# Patient Record
Sex: Male | Born: 1990 | Race: White | Hispanic: No | Marital: Single | Smoking: Never smoker
Health system: Southern US, Community
[De-identification: ages and names within clinical notes are randomized; demographics above are authoritative.]

## PROBLEM LIST (undated history)

## (undated) HISTORY — PX: TONSILLECTOMY: SUR1361

---

## 2013-06-19 HISTORY — PX: NOSE SURGERY: SHX723

## 2016-12-02 ENCOUNTER — Emergency Department
Admission: EM | Admit: 2016-12-02 | Discharge: 2016-12-02 | Disposition: A | Discharge status: Home / Self Care | Payer: Self-pay | Attending: Emergency Medicine | Admitting: Emergency Medicine

## 2016-12-02 ENCOUNTER — Encounter: Payer: Self-pay | Admitting: Emergency Medicine

## 2016-12-02 ENCOUNTER — Emergency Department: Payer: Self-pay

## 2016-12-02 DIAGNOSIS — Y929 Unspecified place or not applicable: Secondary | ICD-10-CM | POA: Insufficient documentation

## 2016-12-02 DIAGNOSIS — Y999 Unspecified external cause status: Secondary | ICD-10-CM | POA: Insufficient documentation

## 2016-12-02 DIAGNOSIS — Y9364 Activity, baseball: Secondary | ICD-10-CM | POA: Insufficient documentation

## 2016-12-02 DIAGNOSIS — S022XXA Fracture of nasal bones, initial encounter for closed fracture: Secondary | ICD-10-CM

## 2016-12-02 DIAGNOSIS — W2103XA Struck by baseball, initial encounter: Secondary | ICD-10-CM | POA: Insufficient documentation

## 2016-12-02 MED ORDER — IBUPROFEN 600 MG PO TABS: 600.0000 mg | ORAL_TABLET | Freq: Three times a day (TID) | ORAL | 0 refills | Status: DC | PRN

## 2016-12-02 MED ORDER — OXYCODONE-ACETAMINOPHEN 5-325 MG PO TABS
1.0000 | ORAL_TABLET | Freq: Once | ORAL | Status: AC
  Administered 2016-12-02: 1 via ORAL
  Filled 2016-12-02: qty 1

## 2016-12-02 MED ORDER — IBUPROFEN 600 MG PO TABS
600.0000 mg | ORAL_TABLET | Freq: Once | ORAL | Status: AC
  Administered 2016-12-02: 600 mg via ORAL
  Filled 2016-12-02: qty 1

## 2016-12-02 MED ORDER — OXYCODONE-ACETAMINOPHEN 7.5-325 MG PO TABS: 1.0000 | ORAL_TABLET | Freq: Four times a day (QID) | ORAL | 0 refills | Status: DC | PRN

## 2016-12-02 NOTE — ED Triage Notes (Signed)
Patient reports being hit in the face with a softball. Reports nose bleed. Swelling noted to nose. Bleeding controlled at this time.

## 2016-12-02 NOTE — ED Provider Notes (Signed)
[facility]   ____________________________________________   None    (approximate)  I have reviewed the triage vital signs and the nursing notes.   HISTORY  Chief Complaint Facial Injury    HPI Evan Le is a 26 y.o. male patient complaining of nasal and forehead pain secondary being hit with softball. Patient denies loss see. Patient state resolved epistaxis. Patient denies LOC, vision disturbance, or vertigo.  History reviewed. No pertinent past medical history.  There are no active problems to display for this patient.   Past Surgical History:  Procedure Laterality Date  . TONSILLECTOMY      Prior to Admission medications   Not on File    Allergies Patient has no known allergies.  No family history on file.  Social History Social History  Substance Use Topics  . Smoking status: Never Smoker  . Smokeless tobacco: Never Used  . Alcohol use Yes    Review of Systems  Constitutional: No fever/chills Eyes: No visual changes. ENT: No sore throat. Mild left lateral deviation of the nose. Cardiovascular: Denies chest pain. Respiratory: Denies shortness of breath. Gastrointestinal: No abdominal pain.  No nausea, no vomiting.  No diarrhea.  No constipation. Genitourinary: Negative for dysuria. Musculoskeletal: Negative for back pain. Skin: Negative for rash. Neurological: Negative for headaches, focal weakness or numbness.   ____________________________________________   PHYSICAL EXAM:  VITAL SIGNS: ED Triage Vitals  Enc Vitals Group     BP 12/02/16 1406 (!) 129/93     Pulse Rate 12/02/16 1406 93     Resp 12/02/16 1406 16     Temp 12/02/16 1406 98.2 F (36.8 C)     Temp Source 12/02/16 1406 Oral     SpO2 12/02/16 1406 99 %     Weight 12/02/16 1359 210 lb (95.3 kg)     Height 12/02/16 1359 6' (1.829 m)     Head Circumference --      Peak Flow --      Pain Score 12/02/16 1358 4      Pain Loc --      Pain Edu? --      Excl. in GC? --     Constitutional: Alert and oriented. Well appearing and in no acute distress. Eyes: Conjunctivae are normal. PERRL. EOMI. Head: Atraumatic. Nose: No congestion/rhinnorhea.Mild left lateral deviation with marked edema. Mouth/Throat: Mucous membranes are moist.  Oropharynx non-erythematous. Neck: No stridor.  No cervical spine tenderness to palpation. Hematological/Lymphatic/Immunilogical: No cervical lymphadenopathy. Cardiovascular: Normal rate, regular rhythm. Grossly normal heart sounds.  Good peripheral circulation. Respiratory: Normal respiratory effort.  No retractions. Lungs CTAB. Neurologic:  Normal speech and language. No gross focal neurologic deficits are appreciated. No gait instability. Skin:  Skin is warm, dry and intact. No rash noted. Psychiatric: Mood and affect are normal. Speech and behavior are normal.  ____________________________________________   LABS (all labs ordered are listed, but only abnormal results are displayed)  Labs Reviewed - No data to display ____________________________________________  EKG   ____________________________________________  RADIOLOGY  No results found.  Nasal fracture __________________________________________   PROCEDURES  Procedure(s) performed: None  Procedures  Critical Care performed: No  ____________________________________________   INITIAL IMPRESSION / ASSESSMENT AND PLAN / ED COURSE  Pertinent labs & imaging results that were available during my care of the patient were reviewed by me and considered in my medical decision making (see chart for details).  Nasal fracture. Discussed x-ray finding with patient. Patient given discharge care  instructions and advised to follow-up with [facility] in 2 days. Return by ER if condition worsens.      ____________________________________________   FINAL CLINICAL IMPRESSION(S) / ED  DIAGNOSES  Final diagnoses:  None      NEW MEDICATIONS STARTED DURING THIS VISIT:  New Prescriptions   No medications on file     Note:  This document was prepared using Dragon voice recognition software and may include unintentional dictation errors.    Joni Reining, PA-C 12/02/16 1504    Schaevitz, Myra Rude, MD 12/02/16 1534

## 2016-12-08 ENCOUNTER — Ambulatory Visit
Admission: RE | Admit: 2016-12-08 | Discharge: 2016-12-08 | Disposition: A | Discharge status: Home / Self Care | Payer: Managed Care, Other (non HMO) | Source: Ambulatory Visit | Attending: Unknown Physician Specialty | Admitting: Unknown Physician Specialty

## 2016-12-08 ENCOUNTER — Encounter: Payer: Self-pay | Admitting: *Deleted

## 2016-12-08 ENCOUNTER — Encounter
Admission: RE | Disposition: A | Discharge status: Home / Self Care | Payer: Self-pay | Source: Ambulatory Visit | Attending: Unknown Physician Specialty

## 2016-12-08 ENCOUNTER — Ambulatory Visit: Payer: Managed Care, Other (non HMO) | Admitting: Student in an Organized Health Care Education/Training Program

## 2016-12-08 DIAGNOSIS — X58XXXA Exposure to other specified factors, initial encounter: Secondary | ICD-10-CM | POA: Insufficient documentation

## 2016-12-08 DIAGNOSIS — S022XXA Fracture of nasal bones, initial encounter for closed fracture: Secondary | ICD-10-CM | POA: Insufficient documentation

## 2016-12-08 DIAGNOSIS — Y9364 Activity, baseball: Secondary | ICD-10-CM | POA: Insufficient documentation

## 2016-12-08 HISTORY — PX: CLOSED REDUCTION NASAL FRACTURE: SHX5365

## 2016-12-08 SURGERY — CLOSED REDUCTION, FRACTURE, NASAL BONE
Anesthesia: General | Site: Nose | Wound class: Clean Contaminated

## 2016-12-08 MED ORDER — FENTANYL CITRATE (PF) 100 MCG/2ML IJ SOLN
INTRAMUSCULAR | Status: DC | PRN
  Administered 2016-12-08: 50 ug via INTRAVENOUS

## 2016-12-08 MED ORDER — ONDANSETRON HCL 4 MG/2ML IJ SOLN
INTRAMUSCULAR | Status: DC | PRN
  Administered 2016-12-08: 4 mg via INTRAVENOUS

## 2016-12-08 MED ORDER — MIDAZOLAM HCL 5 MG/5ML IJ SOLN
INTRAMUSCULAR | Status: DC | PRN
  Administered 2016-12-08: 2 mg via INTRAVENOUS

## 2016-12-08 MED ORDER — OXYMETAZOLINE HCL 0.05 % NA SOLN
1.0000 | Freq: Two times a day (BID) | NASAL | Status: DC
  Administered 2016-12-08: 1 via NASAL

## 2016-12-08 MED ORDER — PHENYLEPHRINE HCL 0.5 % NA SOLN
NASAL | Status: DC | PRN
  Administered 2016-12-08: 30 mL via TOPICAL

## 2016-12-08 MED ORDER — ONDANSETRON HCL 4 MG/2ML IJ SOLN: 4.0000 mg | Freq: Once | INTRAMUSCULAR | Status: DC | PRN

## 2016-12-08 MED ORDER — ACETAMINOPHEN 10 MG/ML IV SOLN
1000.0000 mg | Freq: Once | INTRAVENOUS | Status: AC
  Administered 2016-12-08: 1000 mg via INTRAVENOUS

## 2016-12-08 MED ORDER — LACTATED RINGERS IV SOLN
INTRAVENOUS | Status: DC
  Administered 2016-12-08: 10:00:00 via INTRAVENOUS

## 2016-12-08 MED ORDER — FENTANYL CITRATE (PF) 100 MCG/2ML IJ SOLN
25.0000 ug | INTRAMUSCULAR | Status: DC | PRN
  Administered 2016-12-08: 25 ug via INTRAVENOUS

## 2016-12-08 MED ORDER — OXYCODONE HCL 5 MG/5ML PO SOLN: 5.0000 mg | Freq: Once | ORAL | Status: AC | PRN

## 2016-12-08 MED ORDER — OXYCODONE HCL 5 MG PO TABS
5.0000 mg | ORAL_TABLET | Freq: Once | ORAL | Status: AC | PRN
  Administered 2016-12-08: 5 mg via ORAL

## 2016-12-08 MED ORDER — LIDOCAINE HCL (CARDIAC) 20 MG/ML IV SOLN
INTRAVENOUS | Status: DC | PRN
  Administered 2016-12-08: 50 mg via INTRATRACHEAL

## 2016-12-08 MED ORDER — DEXAMETHASONE SODIUM PHOSPHATE 4 MG/ML IJ SOLN
INTRAMUSCULAR | Status: DC | PRN
  Administered 2016-12-08: 8 mg via INTRAVENOUS

## 2016-12-08 MED ORDER — LACTATED RINGERS IV SOLN: 500.0000 mL | INTRAVENOUS | Status: DC

## 2016-12-08 MED ORDER — SCOPOLAMINE 1 MG/3DAYS TD PT72
1.0000 | MEDICATED_PATCH | TRANSDERMAL | Status: DC
  Administered 2016-12-08: 1.5 mg via TRANSDERMAL

## 2016-12-08 MED ORDER — PROPOFOL 10 MG/ML IV BOLUS
INTRAVENOUS | Status: DC | PRN
  Administered 2016-12-08: 200 mg via INTRAVENOUS

## 2016-12-08 MED ORDER — OXYCODONE-ACETAMINOPHEN 5-325 MG PO TABS: 1.0000 | ORAL_TABLET | ORAL | 0 refills | Status: DC | PRN

## 2016-12-08 SURGICAL SUPPLY — 16 items
BASIN GRAD PLASTIC 32OZ STRL (MISCELLANEOUS) ×3 IMPLANT
CANISTER SUCT 1200ML W/VALVE (MISCELLANEOUS) ×3 IMPLANT
CLOSURE WOUND 1/2 X4 (GAUZE/BANDAGES/DRESSINGS) ×1
COAG SUCT 10F 3.5MM HAND CTRL (MISCELLANEOUS) IMPLANT
COVER MAYO STAND STRL (DRAPES) ×3 IMPLANT
CUP MEDICINE 2OZ PLAST GRAD ST (MISCELLANEOUS) ×6 IMPLANT
ELECT REM PT RETURN 9FT ADLT (ELECTROSURGICAL)
ELECTRODE REM PT RTRN 9FT ADLT (ELECTROSURGICAL) IMPLANT
GAUZE SPONGE 4X4 12PLY STRL (GAUZE/BANDAGES/DRESSINGS) ×3 IMPLANT
GLOVE BIO SURGEON STRL SZ7.5 (GLOVE) ×6 IMPLANT
SPONGE NEURO XRAY DETECT 1X3 (DISPOSABLE) ×3 IMPLANT
STRIP CLOSURE SKIN 1/2X4 (GAUZE/BANDAGES/DRESSINGS) ×2 IMPLANT
SUCTION FRAZIER TIP 10 FR DISP (SUCTIONS) ×3 IMPLANT
TOWEL OR 17X26 4PK STRL BLUE (TOWEL DISPOSABLE) ×3 IMPLANT
TUBING CONNECTING 10 (TUBING) ×2 IMPLANT
TUBING CONNECTING 10' (TUBING) ×1

## 2016-12-08 NOTE — Transfer of Care (Signed)
Immediate Anesthesia Transfer of Care Note  Patient: Evan Le  Procedure(s) Performed: Procedure(s): CLOSED REDUCTION NASAL FRACTURE (N/A)  Patient Location: PACU  Anesthesia Type: General  Level of Consciousness: awake, alert  and patient cooperative  Airway and Oxygen Therapy: Patient Spontanous Breathing and Patient connected to supplemental oxygen  Post-op Assessment: Post-op Vital signs reviewed, Patient's Cardiovascular Status Stable, Respiratory Function Stable, Patent Airway and No signs of Nausea or vomiting  Post-op Vital Signs: Reviewed and stable  Complications: No apparent anesthesia complications

## 2016-12-08 NOTE — Anesthesia Preprocedure Evaluation (Addendum)
Anesthesia Evaluation  Patient identified by MRN, date of birth, ID band Patient awake    Reviewed: Allergy & Precautions, H&P , NPO status , Patient's Chart, lab work & pertinent test results, reviewed documented beta blocker date and time   Airway Mallampati: II  TM Distance: >3 FB Neck ROM: full    Dental no notable dental hx.    Pulmonary neg pulmonary ROS,    Pulmonary exam normal breath sounds clear to auscultation       Cardiovascular Exercise Tolerance: Good negative cardio ROS   Rhythm:regular Rate:Normal     Neuro/Psych negative neurological ROS  negative psych ROS   GI/Hepatic negative GI ROS, Neg liver ROS,   Endo/Other  negative endocrine ROS  Renal/GU negative Renal ROS  negative genitourinary   Musculoskeletal   Abdominal   Peds  Hematology negative hematology ROS (+)   Anesthesia Other Findings   Reproductive/Obstetrics negative OB ROS                             Anesthesia Physical Anesthesia Plan  ASA: I  Anesthesia Plan: General   Post-op Pain Management:    Induction:   PONV Risk Score and Plan: 2 and Ondansetron, Dexamethasone, Propofol, Scopolamine patch - Pre-op and Midazolam  Airway Management Planned:   Additional Equipment:   Intra-op Plan:   Post-operative Plan:   Informed Consent: I have reviewed the patients History and Physical, chart, labs and discussed the procedure including the risks, benefits and alternatives for the proposed anesthesia with the patient or authorized representative who has indicated his/her understanding and acceptance.     Plan Discussed with: CRNA  Anesthesia Plan Comments:         Anesthesia Quick Evaluation

## 2016-12-08 NOTE — Discharge Instructions (Signed)
General Anesthesia, Adult, Care After °These instructions provide you with information about caring for yourself after your procedure. Your health care provider may also give you more specific instructions. Your treatment has been planned according to current medical practices, but problems sometimes occur. Call your health care provider if you have any problems or questions after your procedure. °What can I expect after the procedure? °After the procedure, it is common to have: °· Vomiting. °· A sore throat. °· Mental slowness. ° °It is common to feel: °· Nauseous. °· Cold or shivery. °· Sleepy. °· Tired. °· Sore or achy, even in parts of your body where you did not have surgery. ° °Follow these instructions at home: °For at least 24 hours after the procedure: °· Do not: °? Participate in activities where you could fall or become injured. °? Drive. °? Use heavy machinery. °? Drink alcohol. °? Take sleeping pills or medicines that cause drowsiness. °? Make important decisions or sign legal documents. °? Take care of children on your own. °· Rest. °Eating and drinking °· If you vomit, drink water, juice, or soup when you can drink without vomiting. °· Drink enough fluid to keep your urine clear or pale yellow. °· Make sure you have little or no nausea before eating solid foods. °· Follow the diet recommended by your health care provider. °General instructions °· Have a responsible adult stay with you until you are awake and alert. °· Return to your normal activities as told by your health care provider. Ask your health care provider what activities are safe for you. °· Take over-the-counter and prescription medicines only as told by your health care provider. °· If you smoke, do not smoke without supervision. °· Keep all follow-up visits as told by your health care provider. This is important. °Contact a health care provider if: °· You continue to have nausea or vomiting at home, and medicines are not helpful. °· You  cannot drink fluids or start eating again. °· You cannot urinate after 8-12 hours. °· You develop a skin rash. °· You have fever. °· You have increasing redness at the site of your procedure. °Get help right away if: °· You have difficulty breathing. °· You have chest pain. °· You have unexpected bleeding. °· You feel that you are having a life-threatening or urgent problem. °This information is not intended to replace advice given to you by your health care provider. Make sure you discuss any questions you have with your health care provider. °Document Released: 09/11/2000 Document Revised: 11/08/2015 Document Reviewed: 05/20/2015 °Elsevier Interactive Patient Education © 2018 [employer] °Scopolamine skin patches °What is this medicine? °SCOPOLAMINE (skoe POL a meen) is used to prevent nausea and vomiting caused by motion sickness, anesthesia and surgery. °This medicine may be used for other purposes; ask your health care provider or pharmacist if you have questions. °COMMON BRAND NAME(S): Transderm Scop °What should I tell my health care provider before I take this medicine? °They need to know if you have any of these conditions: °-glaucoma °-kidney or liver disease °-an unusual or allergic reaction (especially skin allergy) to scopolamine, atropine, other medicines, foods, dyes, or preservatives °-pregnant or trying to get pregnant °-breast-feeding °How should I use this medicine? °This medicine is for external use only. Follow the directions on the prescription label. One patch contains enough medicine to prevent motion sickness for up to 3 days. Apply the patch at least 4 hours before you need it and only wear one disc at   a time. Choose an area behind the ear, that is clean, dry, hairless and free from any cuts or irritation. Wipe the area with a clean dry tissue. Peel off the plastic backing of the skin patch, trying not to touch the adhesive side with your hands. Do not cut the patches. Firmly apply to the  area you have chosen, with the metallic side of the patch to the skin and the tan-colored side showing. Once firmly in place, wash your hands well with soap and water. Remove the disc after 3 days, or sooner if you no longer need it. After removing the patch, wash your hands and the area behind your ear thoroughly with soap and water. The patch will still contain some medicine after use. To avoid accidental contact or ingestion by children or pets, fold the used patch in half with the sticky side together and throw away in the trash out of the reach of children and pets. If you need to use a second patch after you remove the first, place it behind the other ear. °Talk to your pediatrician regarding the use of this medicine in children. Special care may be needed. °Overdosage: If you think you have taken too much of this medicine contact a poison control center or emergency room at once. °NOTE: This medicine is only for you. Do not share this medicine with others. °What if I miss a dose? °Make sure you apply the patch at least 4 hours before you need it. You can apply it the night before traveling. °What may interact with this medicine? °-benztropine °-bethanechol °-medicines for anxiety or sleeping problems like diazepam or temazepam °-medicines for hay fever and other allergies °-medicines for mental depression °-muscle relaxants °This list may not describe all possible interactions. Give your health care provider a list of all the medicines, herbs, non-prescription drugs, or dietary supplements you use. Also tell them if you smoke, drink alcohol, or use illegal drugs. Some items may interact with your medicine. °What should I watch for while using this medicine? °Keep the patch dry, if possible, to prevent it from falling off. Limited contact with water, however, as in bathing or swimming, will not affect the system. If the patch falls off, throw it away and put a new one behind the other ear. °You may get drowsy  or dizzy. Do not drive, use machinery, or do anything that needs mental alertness until you know how this medicine affects you. Do not stand or sit up quickly, especially if you are an older patient. This reduces the risk of dizzy or fainting spells. Alcohol may interfere with the effect of this medicine. Avoid alcoholic drinks. °Your mouth may get dry. Chewing sugarless gum or sucking hard candy, and drinking plenty of water may help. Contact your doctor if the problem does not go away or is severe. °This medicine may cause dry eyes and blurred vision. If you wear contact lenses you may feel some discomfort. Lubricating drops may help. See your eye doctor if the problem does not go away or is severe. °If you are going to have a magnetic resonance imaging (MRI) procedure, tell your MRI technician if you have this patch on your body. It must be removed before a MRI. °What side effects may I notice from receiving this medicine? °Side effects that you should report to your doctor or health care professional as soon as possible: °-agitation, nervousness, confusion °-blurred vision and other eye problems °-dizziness, drowsiness °-eye pain or redness in the   whites of the eye °-hallucinations °-pain or difficulty passing urine °-skin rash, itching °-vomiting °Side effects that usually do not require medical attention (report to your doctor or health care professional if they continue or are bothersome): °-headache °-nausea °This list may not describe all possible side effects. Call your doctor for medical advice about side effects. You may report side effects to FDA at 1-800-FDA-1088. °Where should I keep my medicine? °Keep out of the reach of children. °Store at room temperature between 20 and 25 degrees C (68 and 77 degrees F). Throw away any unused medicine after the expiration date. When you remove a patch, fold it and throw it in the trash as described above. °NOTE: This sheet is a summary. It may not cover all  possible information. If you have questions about this medicine, talk to your doctor, pharmacist, or health care provider. °© 2018 Elsevier/Gold Standard (2011-11-02 13:31:48) ° °

## 2016-12-08 NOTE — Anesthesia Postprocedure Evaluation (Signed)
Anesthesia Post Note  Patient: Evan Le  Procedure(s) Performed: Procedure(s) (LRB): CLOSED REDUCTION NASAL FRACTURE (N/A)  Patient location during evaluation: PACU Anesthesia Type: General Level of consciousness: awake and alert Pain management: pain level controlled Vital Signs Assessment: post-procedure vital signs reviewed and stable Respiratory status: spontaneous breathing, nonlabored ventilation and respiratory function stable Cardiovascular status: blood pressure returned to baseline and stable Postop Assessment: no signs of nausea or vomiting Anesthetic complications: no    Tanisha Lutes D Loyalty Arentz

## 2016-12-08 NOTE — Anesthesia Procedure Notes (Signed)
Procedure Name: LMA Insertion Performed by: Shatonia Hoots Pre-anesthesia Checklist: Patient identified, Emergency Drugs available, Suction available, Timeout performed and Patient being monitored Patient Re-evaluated:Patient Re-evaluated prior to inductionOxygen Delivery Method: Circle system utilized Preoxygenation: Pre-oxygenation with 100% oxygen Intubation Type: IV induction LMA: LMA inserted LMA Size: 4.0 Number of attempts: 1 Placement Confirmation: positive ETCO2 and breath sounds checked- equal and bilateral Tube secured with: Tape     

## 2016-12-08 NOTE — Op Note (Signed)
12/08/2016  11:23 AM    Daniel Nonesurner, Jary  [id number]   Pre-Op Dx: NASAL FRACTURE  Post-op Dx: SAME  Proc: Closed reduction nasal fracture   Surg:  Linus SalmonsMCQUEEN,Tanice Petre T  Anes:  GOT  EBL:  Less than 5 cc  Comp:  None  Findings:  Outfracture left nasal bone infracture right nasal bone  Procedure: Each was identified in the holding area taken the operating room placed in supine position. After laryngeal mask anesthesia topical anesthetic of phenylephrine lidocaine was placed on pledgets within each nostril. After 5 minutes these were removed. The left nasal bone was manipulated back in anatomic position and elevator was placed in the right nostril in the right nasal bone was outfractured back in anatomic position this gave excellent alignment of the nasal fracture. Steri-Strips were then placed across nasal dorsum and a aqua Plast was fitted for the nose and placed across the dorsum as a cast. The patient was then awakened in the operating room taken recovery room in stable condition.  Cultures: None  Specimens: None  Dispo:   Good  Plan:  Discharge to home follow-up 1 week  Analayah Brooke T  12/08/2016 11:23 AM

## 2016-12-08 NOTE — H&P (Signed)
The patient's history has been reviewed, patient examined, no change in status, stable for surgery.  Questions were answered to the patients satisfaction.  

## 2018-10-18 IMAGING — CR DG NASAL BONES 3+V
1 series · 3 of 3 positions shown · non-contrast
Comparison: None.

CLINICAL DATA: Hit by baseball.  Deformity to the nose with pain.

EXAM:
NASAL BONES - 3+ VIEW

[Series 1: dg nasal bones · 0.14mm/px · 3 of 3 slices shown]
[im 1/3]
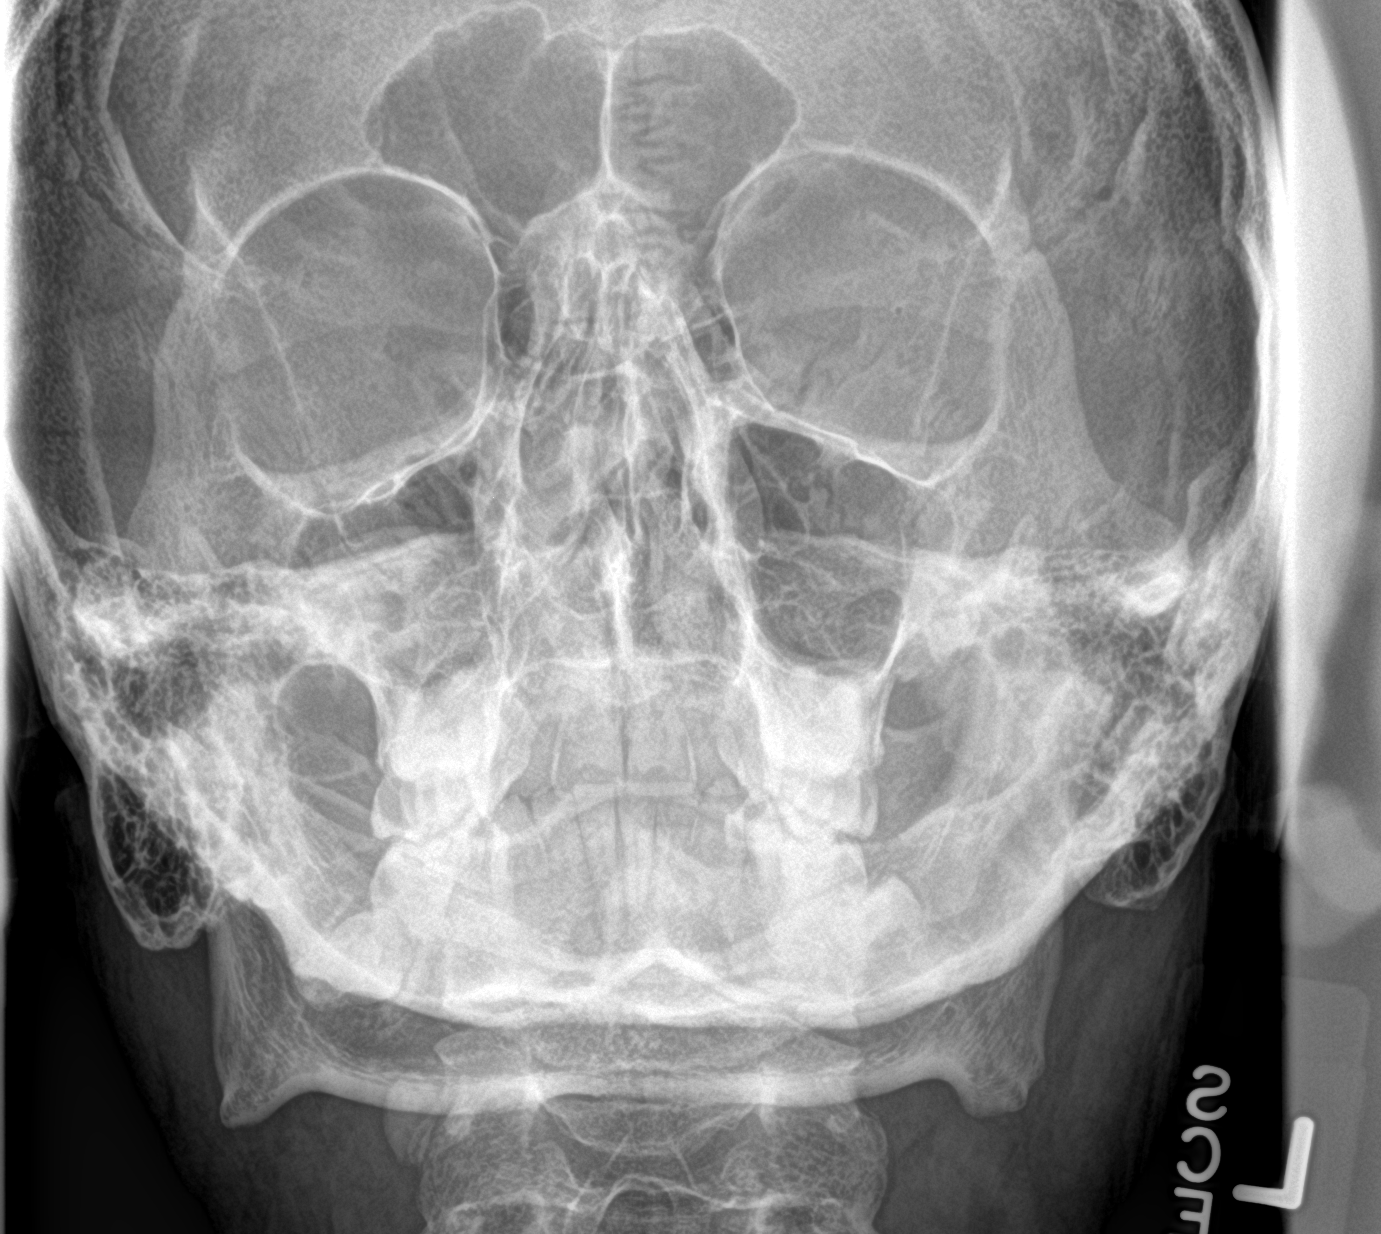
[im 2/3]
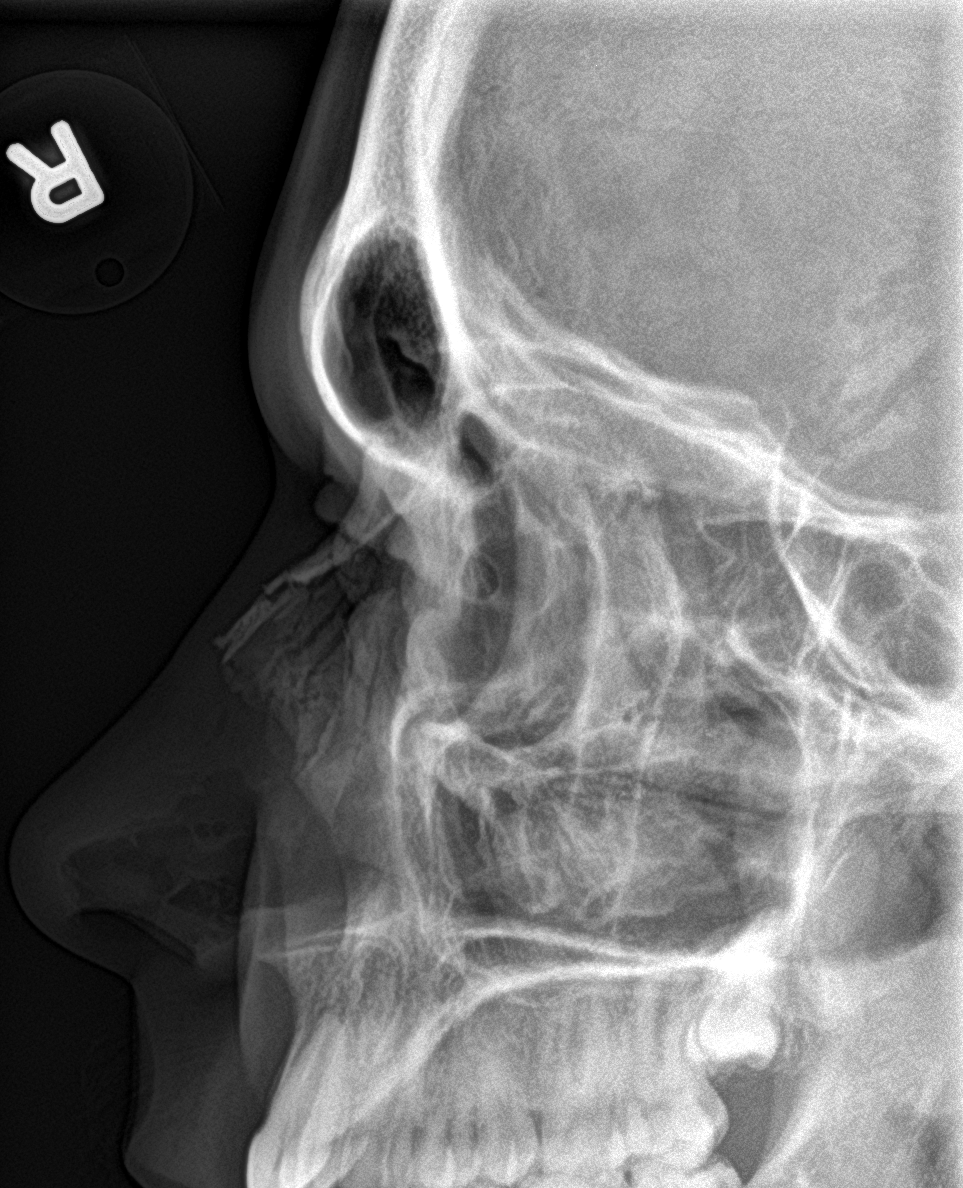
[im 3/3]
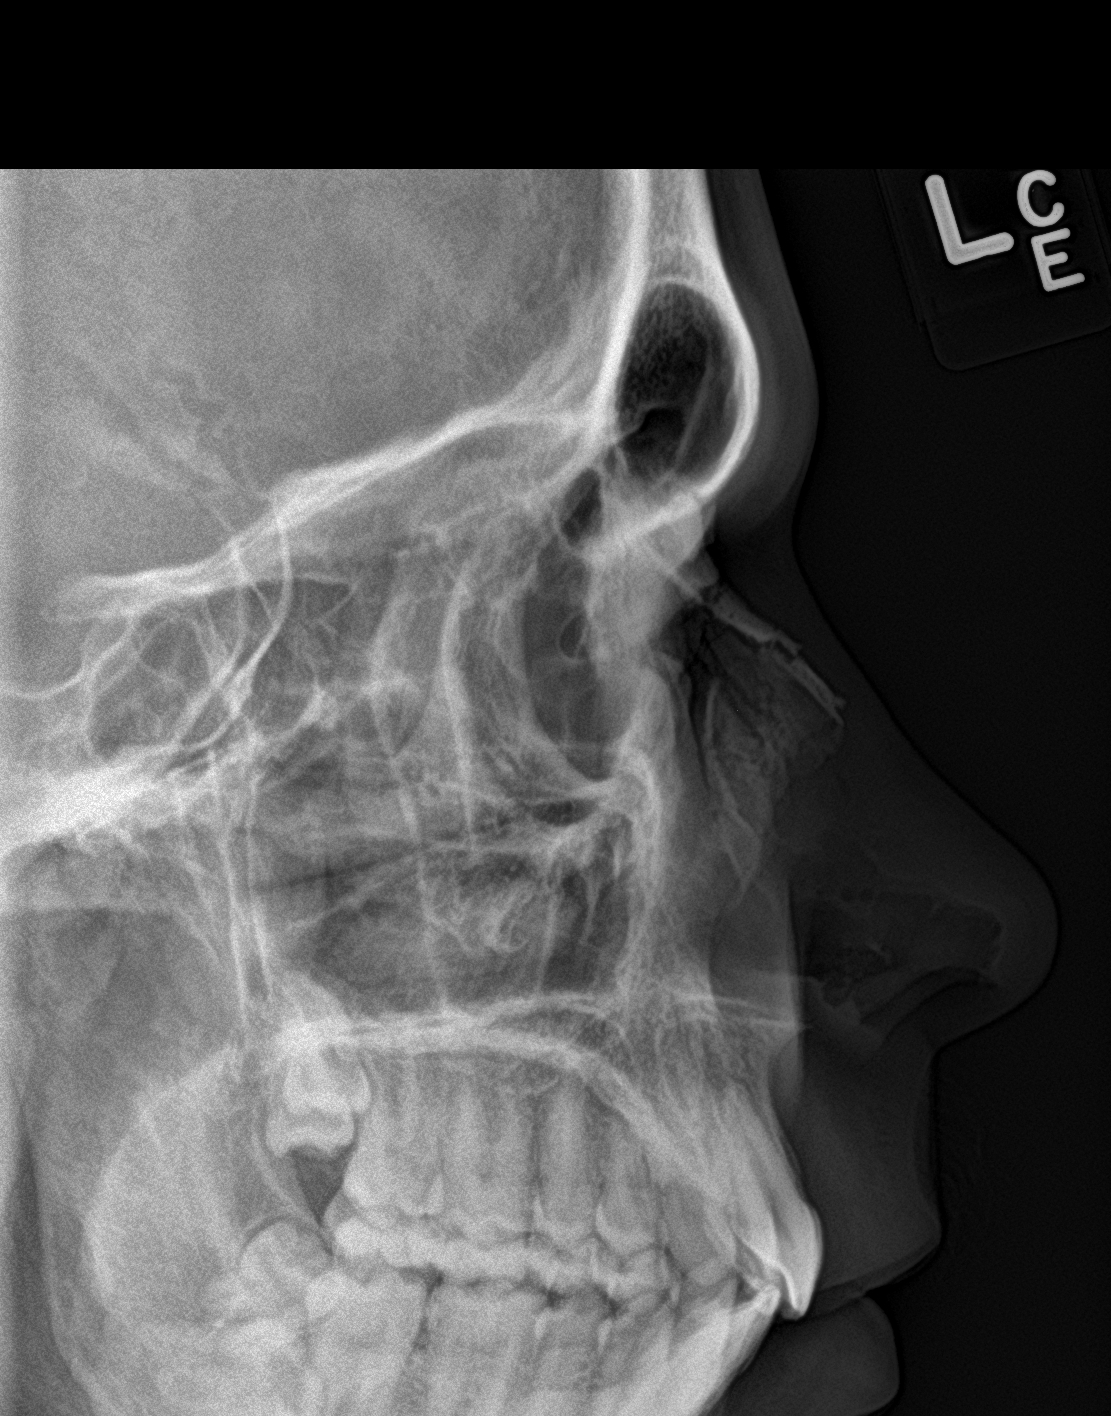

[3 of 3 positions shown; findings below may reference images not displayed]

FINDINGS: Positive for nasal bone fractures. Evidence for comminuted nasal
bone fractures. Visualized paranasal sinuses are aerated.
IMPRESSION: Comminuted nasal bone fractures.

## 2020-02-07 ENCOUNTER — Other Ambulatory Visit: Payer: Self-pay

## 2020-02-07 ENCOUNTER — Emergency Department
Admission: EM | Admit: 2020-02-07 | Discharge: 2020-02-07 | Disposition: A | Discharge status: Home / Self Care | Payer: 59 | Attending: Emergency Medicine | Admitting: Emergency Medicine

## 2020-02-07 ENCOUNTER — Emergency Department: Payer: 59

## 2020-02-07 DIAGNOSIS — S6982XA Other specified injuries of left wrist, hand and finger(s), initial encounter: Secondary | ICD-10-CM | POA: Diagnosis present

## 2020-02-07 DIAGNOSIS — Z23 Encounter for immunization: Secondary | ICD-10-CM | POA: Diagnosis not present

## 2020-02-07 DIAGNOSIS — Y999 Unspecified external cause status: Secondary | ICD-10-CM | POA: Insufficient documentation

## 2020-02-07 DIAGNOSIS — S62633B Displaced fracture of distal phalanx of left middle finger, initial encounter for open fracture: Secondary | ICD-10-CM | POA: Diagnosis not present

## 2020-02-07 DIAGNOSIS — Y929 Unspecified place or not applicable: Secondary | ICD-10-CM | POA: Diagnosis not present

## 2020-02-07 DIAGNOSIS — Y9389 Activity, other specified: Secondary | ICD-10-CM | POA: Diagnosis not present

## 2020-02-07 DIAGNOSIS — W208XXA Other cause of strike by thrown, projected or falling object, initial encounter: Secondary | ICD-10-CM | POA: Diagnosis not present

## 2020-02-07 MED ORDER — LIDOCAINE HCL (PF) 1 % IJ SOLN
5.0000 mL | Freq: Once | INTRAMUSCULAR | Status: AC
  Administered 2020-02-07: 21:00:00 5 mL via INTRADERMAL
  Filled 2020-02-07: qty 5

## 2020-02-07 MED ORDER — OXYCODONE-ACETAMINOPHEN 5-325 MG PO TABS
1.0000 | ORAL_TABLET | Freq: Once | ORAL | Status: AC
  Administered 2020-02-07: 21:00:00 1 via ORAL
  Filled 2020-02-07: qty 1

## 2020-02-07 MED ORDER — TETANUS-DIPHTH-ACELL PERTUSSIS 5-2.5-18.5 LF-MCG/0.5 IM SUSP
0.5000 mL | Freq: Once | INTRAMUSCULAR | Status: AC
  Administered 2020-02-07: 20:00:00 0.5 mL via INTRAMUSCULAR
  Filled 2020-02-07: qty 0.5

## 2020-02-07 MED ORDER — CEPHALEXIN 500 MG PO CAPS
500.0000 mg | ORAL_CAPSULE | Freq: Once | ORAL | Status: AC
  Administered 2020-02-07: 21:00:00 500 mg via ORAL
  Filled 2020-02-07: qty 1

## 2020-02-07 MED ORDER — OXYCODONE-ACETAMINOPHEN 5-325 MG PO TABS: 1.0000 | ORAL_TABLET | ORAL | 0 refills | Status: AC | PRN

## 2020-02-07 MED ORDER — CEPHALEXIN 500 MG PO CAPS: 500.0000 mg | ORAL_CAPSULE | Freq: Three times a day (TID) | ORAL | 0 refills | Status: AC

## 2020-02-07 NOTE — ED Notes (Signed)
Pt is soaking hand in iodine and NS. Pt states several hours ago he dropped a transmission on his hand. Pt states tingling to the 3rd finger on the left hand

## 2020-02-07 NOTE — ED Triage Notes (Signed)
Pt dropped a transmission on left third finger approx 2 hours pta. Laceration noted. Pt is able to move finger.

## 2020-02-07 NOTE — ED Notes (Signed)
petroleum dressing placed to left middle finger. Splint placed over dressings.  Pts wife stated she will be driving the pt home.

## 2020-02-07 NOTE — ED Provider Notes (Signed)
[facility] Emergency Department Provider Note  ____________________________________________   First MD Initiated Contact with Patient 02/07/20 1957     (approximate)  I have reviewed the triage vital signs and the nursing notes.   HISTORY  Chief Complaint Finger Injury    HPI Evan Le is a 29 y.o. male presents emergency department after dropping a transmission on his left middle finger.  Patient is unsure of his last tetanus shot.  He is complaining of a lot of pain in the area.  Laceration around the nail.  No other injuries reported.  Tdap is not up-to-date    No past medical history on file.  There are no problems to display for this patient.   Past Surgical History:  Procedure Laterality Date  . CLOSED REDUCTION NASAL FRACTURE N/A 12/08/2016   Procedure: CLOSED REDUCTION NASAL FRACTURE;  Surgeon: Linus Salmons, MD;  Location: [facility];  Service: ENT;  Laterality: N/A;  . TONSILLECTOMY      Prior to Admission medications   Medication Sig Start Date End Date Taking? Authorizing Provider  cephALEXin (KEFLEX) 500 MG capsule Take 1 capsule (500 mg total) by mouth 3 (three) times daily. 02/07/20   Dailynn Nancarrow, Roselyn Bering, PA-C  oxyCODONE-acetaminophen (PERCOCET) 5-325 MG tablet Take 1 tablet by mouth every 4 (four) hours as needed for severe pain. 02/07/20 02/06/21  Faythe Ghee, PA-C    Allergies Patient has no known allergies.  No family history on file.  Social History Social History   Tobacco Use  . Smoking status: Never Smoker  . Smokeless tobacco: Never Used  Vaping Use  . Vaping Use: Every day  Substance Use Topics  . Alcohol use: Yes    Comment: socially  . Drug use: No    Review of Systems  Constitutional: No fever/chills Eyes: No visual changes. ENT: No sore throat. Respiratory: Denies cough Genitourinary: Negative for dysuria. Musculoskeletal: Negative for back pain.  Positive left middle finger  injury Skin: Negative for rash. Psychiatric: no mood changes,     ____________________________________________   PHYSICAL EXAM:  VITAL SIGNS: ED Triage Vitals  Enc Vitals Group     BP 02/07/20 1907 (!) 151/101     Pulse Rate 02/07/20 1907 86     Resp 02/07/20 1907 16     Temp 02/07/20 1907 99.2 F (37.3 C)     Temp Source 02/07/20 1907 Oral     SpO2 02/07/20 1907 100 %     Weight 02/07/20 1908 210 lb (95.3 kg)     Height 02/07/20 1908 6' (1.829 m)     Head Circumference --      Peak Flow --      Pain Score 02/07/20 1908 6     Pain Loc --      Pain Edu? --      Excl. in GC? --     Constitutional: Alert and oriented. Well appearing and in no acute distress. Eyes: Conjunctivae are normal.  Head: Atraumatic. Nose: No congestion/rhinnorhea. Mouth/Throat: Mucous membranes are moist.   Neck:  supple no lymphadenopathy noted Cardiovascular: Normal rate, regular rhythm. Respiratory: Normal respiratory effort.  No retractions,  GU: deferred Musculoskeletal: FROM all extremities, warm and well perfused, left middle finger is tender at the distal portion with the nail being partially avulsed at the base of the fingernail, area is bleeding Neurologic:  Normal speech and language.  Skin:  Skin is warm, dry, positive partial nail avulsion. No rash noted. Psychiatric: Mood  and affect are normal. Speech and behavior are normal.  ____________________________________________   LABS (all labs ordered are listed, but only abnormal results are displayed)  Labs Reviewed - No data to display ____________________________________________   ____________________________________________  RADIOLOGY  X-ray of the left middle finger shows a fracture of the distal phalanx  ____________________________________________   PROCEDURES  Procedure(s) performed:   .Nail Removal  Date/Time: 02/07/2020 9:58 PM Performed by: Faythe Ghee, PA-C Authorized by: Faythe Ghee, PA-C    Consent:    Consent obtained:  Verbal   Consent given by:  Patient   Risks discussed:  Pain and permanent nail deformity Location:    Hand:  L long finger Anesthesia (see MAR for exact dosages):    Anesthesia method:  Nerve block   Block anesthetic:  Lidocaine 1% w/o epi   Block injection procedure:  Anatomic landmarks identified, introduced needle, incremental injection, anatomic landmarks palpated and negative aspiration for blood   Block outcome:  Anesthesia achieved Nail Removal:    Removed nail replaced and anchored: yes   Post-procedure details:    Dressing:  Splint and 4x4 sterile gauze   Patient tolerance of procedure:  Tolerated well, no immediate complications Comments:     3 sutures placed to tack the nail down, splint applied for fracture      ____________________________________________   INITIAL IMPRESSION / ASSESSMENT AND PLAN / ED COURSE  Pertinent labs & imaging results that were available during my care of the patient were reviewed by me and considered in my medical decision making (see chart for details).   Patient is a 29 year old male presents emergency department with laceration and injury to left middle finger.  See HPI physical exam shows partial nail avulsion at the base of the fingernail along with bruising and tenderness distally of the left middle finger.  Remainder of exam is unremarkable  Tdap updated by nursing staff X-ray of the left middle finger does show a fracture of the distal phalanx See procedure note for repair of the partial nail avulsion Patient was placed in a finger splint.  He is to follow-up with orthopedics.  Was given a dose of Keflex and a Percocet while here in the ED.  Prescription for Keflex and Percocet sent to the pharmacy.  He is to keep the bandage on.  If he bleeds through the bandage he can change it.  Return emergency department worsening     Evan Le was evaluated in Emergency Department on 02/07/2020 for  the symptoms described in the history of present illness. He was evaluated in the context of the global COVID-19 pandemic, which necessitated consideration that the patient might be at risk for infection with the SARS-CoV-2 virus that causes COVID-19. Institutional protocols and algorithms that pertain to the evaluation of patients at risk for COVID-19 are in a state of rapid change based on information released by regulatory bodies including the CDC and federal and state organizations. These policies and algorithms were followed during the patient's care in the ED.    As part of my medical decision making, I reviewed the following data within the electronic MEDICAL RECORD NUMBER History obtained from family, Nursing notes reviewed and incorporated, Old chart reviewed, Radiograph reviewed , Notes from prior ED visits and [place] Controlled Substance Database  ____________________________________________   FINAL CLINICAL IMPRESSION(S) / ED DIAGNOSES  Final diagnoses:  Open displaced fracture of distal phalanx of left middle finger, initial encounter      NEW MEDICATIONS STARTED DURING  THIS VISIT:  Discharge Medication List as of 02/07/2020  8:53 PM    START taking these medications   Details  cephALEXin (KEFLEX) 500 MG capsule Take 1 capsule (500 mg total) by mouth 3 (three) times daily., Starting Sat 02/07/2020, Normal         Note:  This document was prepared using Dragon voice recognition software and may include unintentional dictation errors.    Faythe Ghee, PA-C 02/07/20 2201    Sharman Cheek, MD 02/07/20 [phone]

## 2020-02-07 NOTE — Discharge Instructions (Addendum)
Follow-up with [facility].  Please call for an appointment.  Return emergency department worsening.  Take medications as prescribed.  Elevate and ice.  Wear splint.  If you drain through the dressing you can change it if not please leave it intact until you see orthopedics

## 2021-12-23 IMAGING — CR DG FINGER MIDDLE 2+V*L*
1 series · 3 of 3 positions shown · non-contrast
Comparison: None.

CLINICAL DATA: Right middle finger pain following crush injury

EXAM:
LEFT MIDDLE FINGER 2+V

[Series 1: dg finger middle left · 0.14mm/px · 3 of 3 slices shown]
[im 1/3]
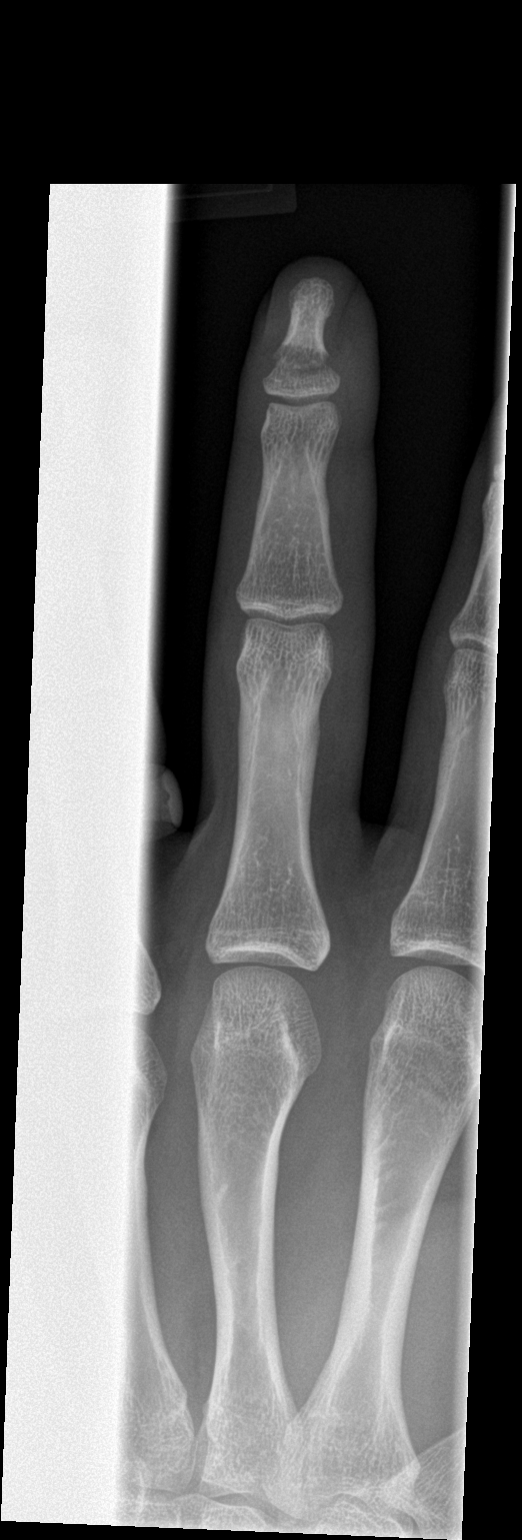
[im 2/3]
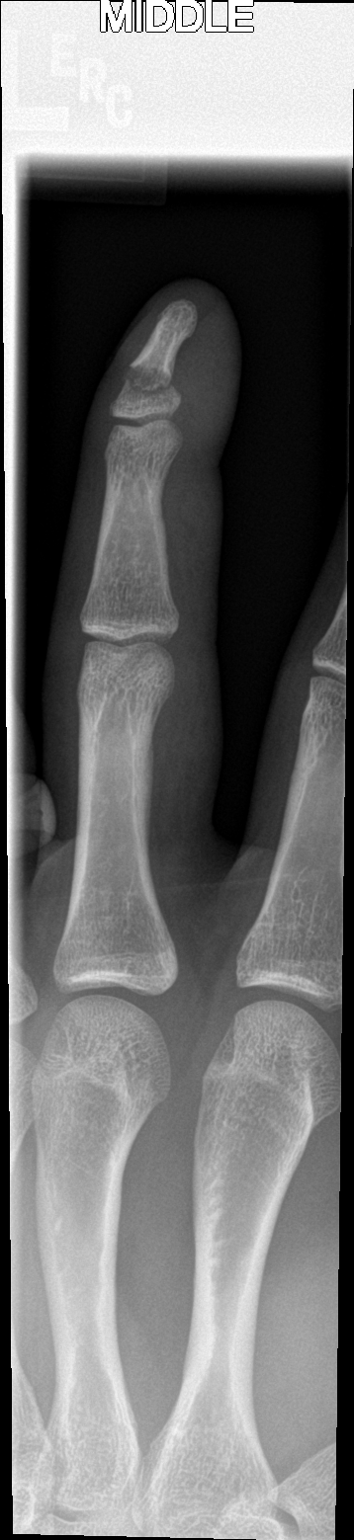
[im 3/3]
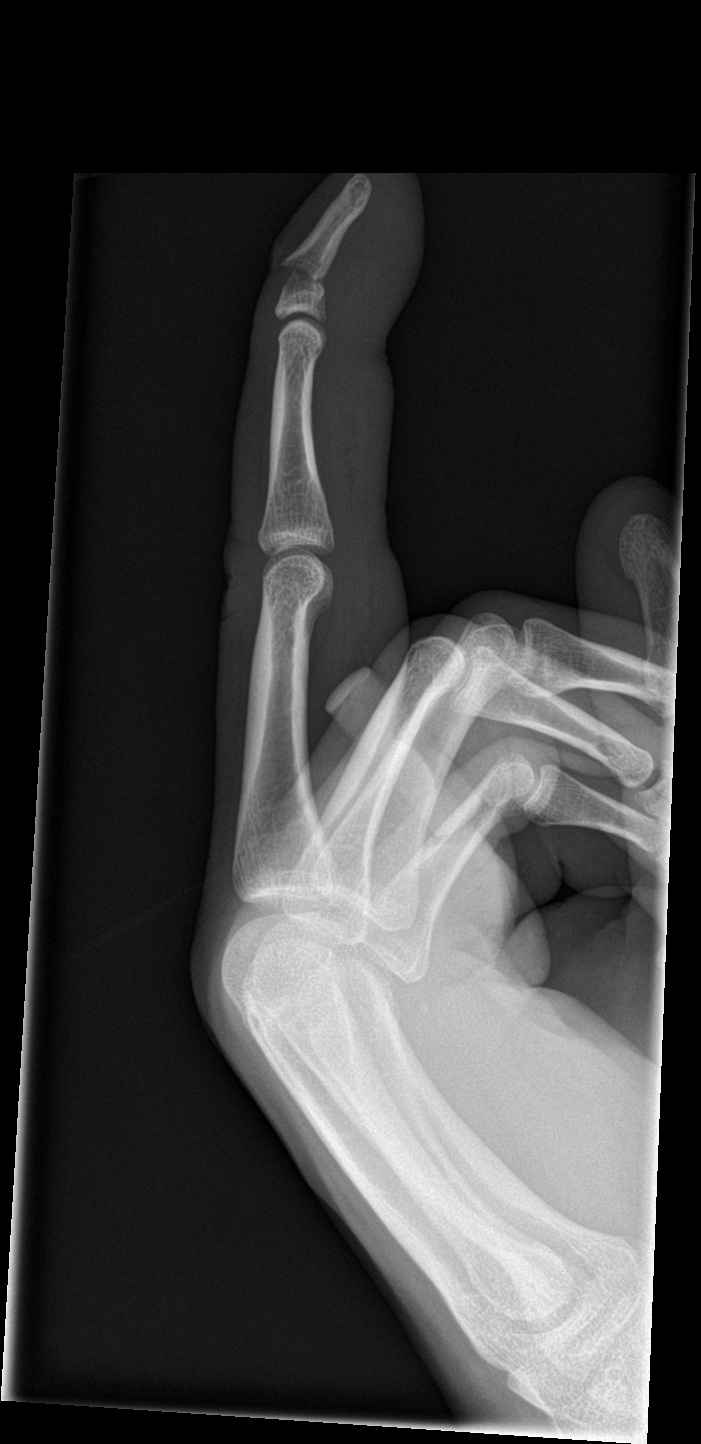

[3 of 3 positions shown; findings below may reference images not displayed]

FINDINGS: Three view radiograph of the right middle finger demonstrates a
transverse extra-articular fracture of the base of the distal
phalanx with 2 cortical widths dorsal displacement and approximately
25-30 degrees of a palmar angulation of the distal fracture
fragment. Moderate surrounding soft tissue swelling. No other
fracture or dislocation identified. No retained radiopaque foreign
body.
IMPRESSION: Mildly displaced and angulated extra-articular fracture of the base
of the distal phalanx of the right middle finger.

## 2023-05-14 ENCOUNTER — Encounter: Payer: Self-pay | Admitting: Physician Assistant

## 2023-05-14 ENCOUNTER — Ambulatory Visit: Payer: BC Managed Care – PPO | Admitting: Physician Assistant

## 2023-05-14 VITALS — BP 122/76 | HR 91 | Temp 98.2°F | Ht 73.0 in | Wt 196.0 lb

## 2023-05-14 DIAGNOSIS — R739 Hyperglycemia, unspecified: Secondary | ICD-10-CM | POA: Diagnosis not present

## 2023-05-14 DIAGNOSIS — Z0000 Encounter for general adult medical examination without abnormal findings: Secondary | ICD-10-CM | POA: Diagnosis not present

## 2023-05-14 DIAGNOSIS — D7589 Other specified diseases of blood and blood-forming organs: Secondary | ICD-10-CM | POA: Diagnosis not present

## 2023-05-14 DIAGNOSIS — F1020 Alcohol dependence, uncomplicated: Secondary | ICD-10-CM

## 2023-05-14 DIAGNOSIS — K5900 Constipation, unspecified: Secondary | ICD-10-CM

## 2023-05-14 NOTE — Progress Notes (Signed)
Date:  05/14/2023   Name:  Evan Le   DOB:  Oct 02, 1990   MRN:  [id number]   Chief Complaint: New Patient (Initial Visit), Fatigue (Chronic fatigue, and body aches for the past year. ), and Constipation (Started a year ago. Sometimes cannot go to the bathroom even though he feels the urge to have a bm. No stomach pain, but sometimes has GERD ( burning, and indigestion))  HPI Evan Le is a pleasant 32 year old male with a history of alcohol use disorder who presents new to the clinic today to establish care and discuss some minor concerns as listed above.  Last Physical: never Last Dental Exam: 10y ago. Brushes once daily.  Does not floss Last Eye Exam: never  Heavy alcohol use ~60 beers weekly, 6-12 beers daily. Father also struggled with alcoholism and had liver and kidney failure.    Medication list has been reviewed and updated.  No outpatient medications have been marked as taking for the 05/14/23 encounter (Office Visit) with Remo Lipps, PA.     Review of Systems  There are no problems to display for this patient.   No Known Allergies  Immunization History  Administered Date(s) Administered   Tdap 02/07/2020    Past Surgical History:  Procedure Laterality Date   CLOSED REDUCTION NASAL FRACTURE N/A 12/08/2016   Procedure: CLOSED REDUCTION NASAL FRACTURE;  Surgeon: Linus Salmons, MD;  Location: [facility];  Service: ENT;  Laterality: N/A;   NOSE SURGERY  2015   fracture   TONSILLECTOMY      Social History   Tobacco Use   Smoking status: Never   Smokeless tobacco: Never  Vaping Use   Vaping status: Every Day   Substances: Nicotine, Flavoring  Substance Use Topics   Alcohol use: Yes    Alcohol/week: 60.0 standard drinks of alcohol    Types: 60 Standard drinks or equivalent per week    Comment: 6 beers/day, except Fri-Sun which is 12 beers daily   Drug use: Not Currently   Flowsheet Row Office Visit from  05/14/2023 in [facility]  AUDIT-C Score 12       Family History  Problem Relation Age of Onset   Colon cancer Mother 47   Alcoholism Father    Liver disease Father    Kidney disease Father          No data to display              No data to display          BP Readings from Last 3 Encounters:  05/14/23 122/76  02/07/20 (!) 146/98  12/08/16 (!) 126/92    Wt Readings from Last 3 Encounters:  05/14/23 196 lb (88.9 kg)  02/07/20 210 lb (95.3 kg)  12/08/16 210 lb (95.3 kg)    BP 122/76   Pulse 91   Temp 98.2 F (36.8 C) (Oral)   Ht 6\' 1"  (1.854 m)   Wt 196 lb (88.9 kg)   SpO2 97%   BMI 25.86 kg/m   Physical Exam Vitals and nursing note reviewed.  Constitutional:      Appearance: Normal appearance.  HENT:     Ears:     Comments: EAC clear bilaterally with good view of TM which is without effusion or erythema.     Nose: Nose normal.     Mouth/Throat:     Mouth: Mucous membranes are moist. No oral  lesions.     Dentition: Normal dentition.     Pharynx: No posterior oropharyngeal erythema.  Eyes:     Extraocular Movements: Extraocular movements intact.     Conjunctiva/sclera: Conjunctivae normal.     Pupils: Pupils are equal, round, and reactive to light.  Neck:     Thyroid: No thyromegaly.  Cardiovascular:     Rate and Rhythm: Normal rate and regular rhythm.     Heart sounds: No murmur heard.    No friction rub. No gallop.     Comments: Pulses 2+ at radial, PT, DP bilaterally. No carotid bruit. No peripheral edema Pulmonary:     Effort: Pulmonary effort is normal.     Breath sounds: Normal breath sounds.  Abdominal:     General: Bowel sounds are normal.     Palpations: Abdomen is soft. There is no mass.     Tenderness: There is no abdominal tenderness.  Genitourinary:    Comments: Genital/rectal exam deferred. Reviewed technique for testicular self-exam. Musculoskeletal:     Comments: Full  ROM with strength 5/5 bilateral upper and lower extremities  Lymphadenopathy:     Cervical: No cervical adenopathy.  Skin:    General: Skin is warm.     Capillary Refill: Capillary refill takes less than 2 seconds.     Findings: No lesion or rash.  Neurological:     Mental Status: He is alert and oriented to person, place, and time.     Gait: Gait is intact.  Psychiatric:        Mood and Affect: Mood normal.        Behavior: Behavior normal.     Vision Screening   Right eye Left eye Both eyes  Without correction 20/50 20/50+ 20/40-2  With correction       Recent Labs     Component Value Date/Time   NA 134 05/14/2023 1612   K 4.8 05/14/2023 1612   CL 92 (L) 05/14/2023 1612   CO2 25 05/14/2023 1612   GLUCOSE 129 (H) 05/14/2023 1612   BUN 6 05/14/2023 1612   CREATININE 0.97 05/14/2023 1612   CALCIUM 9.2 05/14/2023 1612   PROT 7.7 05/14/2023 1612   ALBUMIN 4.7 05/14/2023 1612   AST 76 (H) 05/14/2023 1612   ALT 62 (H) 05/14/2023 1612   ALKPHOS 121 05/14/2023 1612   BILITOT 0.3 05/14/2023 1612    Lab Results  Component Value Date   WBC 7.6 05/14/2023   HGB 13.4 05/14/2023   HCT 38.7 05/14/2023   MCV 100 (H) 05/14/2023   PLT 215 05/14/2023   No results found for: "HGBA1C" No results found for: "CHOL", "HDL", "LDLCALC", "LDLDIRECT", "TRIG", "CHOLHDL" Lab Results  Component Value Date   TSH 0.721 05/14/2023     Assessment and Plan:  1. Annual physical exam Seemingly healthy patient with minor abnormalities on exam. Priority on decreasing alcohol. Encouraged healthy lifestyle including regular physical activity and consumption of whole fruits and vegetables. Encouraged routine dental and eye exams.  Reviewed the of getting a routine eye exam soon, as technically he should not be driving with binocular vision worse than 20/40.   - CBC with Differential/Platelet - Comprehensive metabolic panel - TSH  2. Alcohol use disorder, severe, dependence (HCC) Strongly  emphasized alcohol reduction, with eventual goal of 2 or fewer drinks per day.  Checking labs today.     Return in about 3 months (around 08/14/2023) for OV f/u chronic conditions. And 1y for CPE   Alvester Morin, PA-C,  DMSc, Nutritionist [facility] [phone]

## 2023-05-15 ENCOUNTER — Encounter: Payer: Self-pay | Admitting: Physician Assistant

## 2023-05-15 DIAGNOSIS — F1020 Alcohol dependence, uncomplicated: Secondary | ICD-10-CM | POA: Insufficient documentation

## 2023-05-15 LAB — TSH: TSH: 0.721 u[IU]/mL (ref 0.450–4.500)

## 2023-05-15 LAB — CBC WITH DIFFERENTIAL/PLATELET
Basophils Absolute: 0.1 10*3/uL (ref 0.0–0.2)
Basos: 1 %
EOS (ABSOLUTE): 0 10*3/uL (ref 0.0–0.4)
Eos: 0 %
Hematocrit: 38.7 % (ref 37.5–51.0)
Hemoglobin: 13.4 g/dL (ref 13.0–17.7)
Immature Grans (Abs): 0 10*3/uL (ref 0.0–0.1)
Immature Granulocytes: 0 %
Lymphocytes Absolute: 2 10*3/uL (ref 0.7–3.1)
Lymphs: 27 %
MCH: 34.6 pg — ABNORMAL HIGH (ref 26.6–33.0)
MCHC: 34.6 g/dL (ref 31.5–35.7)
MCV: 100 fL — ABNORMAL HIGH (ref 79–97)
Monocytes Absolute: 0.6 10*3/uL (ref 0.1–0.9)
Monocytes: 8 %
Neutrophils Absolute: 4.9 10*3/uL (ref 1.4–7.0)
Neutrophils: 64 %
Platelets: 215 10*3/uL (ref 150–450)
RBC: 3.87 x10E6/uL — ABNORMAL LOW (ref 4.14–5.80)
RDW: 12.5 % (ref 11.6–15.4)
WBC: 7.6 10*3/uL (ref 3.4–10.8)

## 2023-05-15 LAB — COMPREHENSIVE METABOLIC PANEL
ALT: 62 [IU]/L — ABNORMAL HIGH (ref 0–44)
AST: 76 [IU]/L — ABNORMAL HIGH (ref 0–40)
Albumin: 4.7 g/dL (ref 4.1–5.1)
Alkaline Phosphatase: 121 [IU]/L (ref 44–121)
BUN/Creatinine Ratio: 6 — ABNORMAL LOW (ref 9–20)
BUN: 6 mg/dL (ref 6–20)
Bilirubin Total: 0.3 mg/dL (ref 0.0–1.2)
CO2: 25 mmol/L (ref 20–29)
Calcium: 9.2 mg/dL (ref 8.7–10.2)
Chloride: 92 mmol/L — ABNORMAL LOW (ref 96–106)
Creatinine, Ser: 0.97 mg/dL (ref 0.76–1.27)
Globulin, Total: 3 g/dL (ref 1.5–4.5)
Glucose: 129 mg/dL — ABNORMAL HIGH (ref 70–99)
Potassium: 4.8 mmol/L (ref 3.5–5.2)
Sodium: 134 mmol/L (ref 134–144)
Total Protein: 7.7 g/dL (ref 6.0–8.5)
eGFR: 105 mL/min/{1.73_m2} (ref >59)

## 2023-05-16 LAB — SPECIMEN STATUS REPORT

## 2023-05-16 LAB — HGB A1C W/O EAG: Hgb A1c MFr Bld: 5.2 % (ref 4.8–5.6)

## 2023-05-16 LAB — B12 AND FOLATE PANEL
Folate: 9.5 ng/mL (ref >3.0)
Vitamin B-12: 532 pg/mL (ref 232–1245)

## 2023-08-15 ENCOUNTER — Ambulatory Visit: Payer: Self-pay | Admitting: Physician Assistant

## 2023-10-12 ENCOUNTER — Ambulatory Visit: Payer: Self-pay | Admitting: Physician Assistant
